# Patient Record
Sex: Female | Born: 2005 | Race: Black or African American | Hispanic: No | Marital: Single | Smoking: Never smoker
Health system: Southern US, Community
[De-identification: ages and names within clinical notes are randomized; demographics above are authoritative.]

## PROBLEM LIST (undated history)

## (undated) HISTORY — PX: TOOTH EXTRACTION: SUR596

---
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
## 2024-03-29 ENCOUNTER — Other Ambulatory Visit (HOSPITAL_BASED_OUTPATIENT_CLINIC_OR_DEPARTMENT_OTHER): Payer: Self-pay | Admitting: Family Medicine

## 2024-03-29 DIAGNOSIS — Z0000 Encounter for general adult medical examination without abnormal findings: Secondary | ICD-10-CM

## 2024-04-20 ENCOUNTER — Ambulatory Visit (INDEPENDENT_AMBULATORY_CARE_PROVIDER_SITE_OTHER): Admitting: Family Medicine

## 2024-04-20 ENCOUNTER — Encounter (HOSPITAL_BASED_OUTPATIENT_CLINIC_OR_DEPARTMENT_OTHER): Payer: Self-pay | Admitting: Family Medicine

## 2024-04-20 DIAGNOSIS — F320 Major depressive disorder, single episode, mild: Secondary | ICD-10-CM | POA: Diagnosis not present

## 2024-04-20 DIAGNOSIS — Z23 Encounter for immunization: Secondary | ICD-10-CM

## 2024-04-20 DIAGNOSIS — R636 Underweight: Secondary | ICD-10-CM

## 2024-04-20 MED ORDER — PAROXETINE HCL 10 MG PO TABS: 10.0000 mg | ORAL_TABLET | Freq: Every day | ORAL | 3 refills | Status: DC

## 2024-04-20 NOTE — Progress Notes (Signed)
 Subjective:   Mary Tanner Jul 07, 2005 04/20/2024  Chief Complaint  Patient presents with   Follow-up    Patient is here for a follow up with her labs. States she's been dizzy, but most likely because she doesn't eat enough.     Discussed the use of AI scribe software for clinical note transcription with the patient, who gave verbal consent to proceed.   HPI: Mary Tanner presents today for re-assessment and management of chronic medical conditions.  She has tried gaining weight and being consistent with 3 meals daily and snacks. She states she does not have a big desire to finish meals and eating throughout the day. She has maintained her weight. She denies concerns of depression. She states she does eat mostly non-nutritious foods and is a picky eater. She is accompanied by her mom today to her visit who verifies history.  She is using meal shakes as well.      10/20/2023    2:24 PM 10/19/2022    2:20 PM 10/20/2021    1:35 PM 05/20/2021    9:49 AM 11/19/2020    8:54 AM  Depression screen PHQ 2/9  Decreased Interest 0 0 0  0  Down, Depressed, Hopeless 0 0 0  0  PHQ - 2 Score 0 0 0  0  Altered sleeping 3 0   1  Tired, decreased energy 0 0   0  Change in appetite 3 1   1   Feeling bad or failure about yourself  0 0   0  Trouble concentrating 0 0   0  Moving slowly or fidgety/restless 0 0   0  Suicidal thoughts 0 0   0  PHQ-9 Score 6  1    2    Difficult doing work/chores Not difficult at all Not difficult at all   Not difficult at all     Information is confidential and restricted. Go to Review Flowsheets to unlock data.   Data saved with a previous flowsheet row definition    Wt Readings from Last 3 Encounters:  04/20/24 110 lb 9.6 oz (50.2 kg) (17%, Z= -0.97)*  10/20/23 110 lb 8 oz (50.1 kg) (18%, Z= -0.92)*  10/19/22 110 lb (49.9 kg) (20%, Z= -0.83)*   * Growth percentiles are based on CDC (Girls, 2-20 Years) data.     The following portions of the patient's  history were reviewed and updated as appropriate: past medical history, past surgical history, family history, social history, allergies, medications, and problem list.   Patient Active Problem List   Diagnosis Date Noted   History of iron deficiency 10/20/2023   Wellness examination 10/19/2022   Underweight in adolescence 10/19/2022   History reviewed. No pertinent past medical history. Past Surgical History:  Procedure Laterality Date   TOOTH EXTRACTION     History reviewed. No pertinent family history. Outpatient Medications Prior to Visit  Medication Sig Dispense Refill   triamcinolone  ointment (KENALOG ) 0.1 % APPLY TO AFFECTED AREA TWICE A DAY 120 g 2   JUNEL FE 1/20 1-20 MG-MCG tablet TAKE 1 TABLET BY MOUTH EVERY DAY (Patient not taking: Reported on 04/20/2024) 84 tablet 3   No facility-administered medications prior to visit.   Allergies[1]   ROS: A complete ROS was performed with pertinent positives/negatives noted in the HPI. The remainder of the ROS are negative.    Objective:   Today's Vitals   04/20/24 1105  BP: 114/75  Pulse: 98  SpO2: 98%  Weight: 110  lb 9.6 oz (50.2 kg)  Height: 5' 8 (1.727 m)    Physical Exam   GENERAL: Well-appearing, in NAD. Well nourished.  SKIN: Pink, warm and dry. Head: Normocephalic. NECK: Trachea midline. Full ROM w/o pain or tenderness.  RESPIRATORY: Chest wall symmetrical. Respirations even and non-labored.  MSK: Muscle tone and strength appropriate for age.  NEUROLOGIC: No motor or sensory deficits. Steady, even gait. C2-C12 intact.  PSYCH/MENTAL STATUS: Alert, oriented x 3. Cooperative, appropriate mood and affect.      Assessment & Plan:  1. Underweight in adolescence (Primary) 2. Depression, major, single episode, mild Discussed concerns of low BMI. Patient has maintained weight in the past 6 months but has not gained with dietary changes. She desires to trial Paxil to help with food craving and gaining weight with  mood change. Safe use, possible side effects and adverse effects reviewed with patient and will follow up in 6 weeks or sooner if needed.   3. Immunization due 2nd Men B vaccine provided in office today.  - Meningococcal B, OMV (Bexsero)   Meds ordered this encounter  Medications   PARoxetine (PAXIL) 10 MG tablet    Sig: Take 1 tablet (10 mg total) by mouth daily.    Dispense:  30 tablet    Refill:  3    Supervising Provider:   DE CUBA, RAYMOND J [[id number]]   Lab Orders  No laboratory test(s) ordered today   Return for 6-8 weeks Virtual Follow up ; 6 months AE (after 6/11) .    Patient to reach out to office if new, worrisome, or unresolved symptoms arise or if no improvement in patient's condition. Patient verbalized understanding and is agreeable to treatment plan. All questions answered to patient's satisfaction.    Mary Schuyler Stark, FNP    [1] No Known Allergies

## 2024-05-12 ENCOUNTER — Other Ambulatory Visit (HOSPITAL_BASED_OUTPATIENT_CLINIC_OR_DEPARTMENT_OTHER): Payer: Self-pay | Admitting: Family Medicine

## 2024-06-01 ENCOUNTER — Ambulatory Visit (HOSPITAL_BASED_OUTPATIENT_CLINIC_OR_DEPARTMENT_OTHER): Admitting: Family Medicine

## 2024-06-05 ENCOUNTER — Encounter (HOSPITAL_BASED_OUTPATIENT_CLINIC_OR_DEPARTMENT_OTHER): Payer: Self-pay | Admitting: *Deleted

## 2024-06-05 ENCOUNTER — Ambulatory Visit (HOSPITAL_BASED_OUTPATIENT_CLINIC_OR_DEPARTMENT_OTHER): Admitting: Family Medicine

## 2024-10-23 ENCOUNTER — Encounter (HOSPITAL_BASED_OUTPATIENT_CLINIC_OR_DEPARTMENT_OTHER): Admitting: Family Medicine
